# Patient Record
Sex: Male | Born: 1965 | Race: White | Hispanic: No
Health system: Southern US, Community
[De-identification: ages and names within clinical notes are randomized; demographics above are authoritative.]

---

## 2018-02-25 ENCOUNTER — Emergency Department (HOSPITAL_COMMUNITY): Payer: BLUE CROSS/BLUE SHIELD

## 2018-02-25 ENCOUNTER — Emergency Department (HOSPITAL_COMMUNITY)
Admission: EM | Admit: 2018-02-25 | Discharge: 2018-02-25 | Disposition: A | Discharge status: Home / Self Care | Payer: BLUE CROSS/BLUE SHIELD | Attending: Emergency Medicine | Admitting: Emergency Medicine

## 2018-02-25 ENCOUNTER — Encounter (HOSPITAL_COMMUNITY): Payer: Self-pay | Admitting: Radiology

## 2018-02-25 DIAGNOSIS — R1032 Left lower quadrant pain: Secondary | ICD-10-CM | POA: Insufficient documentation

## 2018-02-25 DIAGNOSIS — Y939 Activity, unspecified: Secondary | ICD-10-CM | POA: Diagnosis not present

## 2018-02-25 DIAGNOSIS — Y929 Unspecified place or not applicable: Secondary | ICD-10-CM | POA: Diagnosis not present

## 2018-02-25 DIAGNOSIS — R0789 Other chest pain: Secondary | ICD-10-CM | POA: Insufficient documentation

## 2018-02-25 DIAGNOSIS — Z041 Encounter for examination and observation following transport accident: Secondary | ICD-10-CM | POA: Diagnosis present

## 2018-02-25 DIAGNOSIS — Y999 Unspecified external cause status: Secondary | ICD-10-CM | POA: Diagnosis not present

## 2018-02-25 LAB — CBC WITH DIFFERENTIAL/PLATELET
BASOS ABS: 0.1 10*3/uL (ref 0.0–0.1)
Basophils Relative: 1 %
EOS ABS: 0.3 10*3/uL (ref 0.0–0.7)
EOS PCT: 3 %
HCT: 43.1 % (ref 39.0–52.0)
HEMOGLOBIN: 14.2 g/dL (ref 13.0–17.0)
LYMPHS ABS: 1.7 10*3/uL (ref 0.7–4.0)
LYMPHS PCT: 18 %
MCH: 30.6 pg (ref 26.0–34.0)
MCHC: 32.9 g/dL (ref 30.0–36.0)
MCV: 92.9 fL (ref 78.0–100.0)
Monocytes Absolute: 1.3 10*3/uL — ABNORMAL HIGH (ref 0.1–1.0)
Monocytes Relative: 14 %
NEUTROS PCT: 64 %
Neutro Abs: 6.4 10*3/uL (ref 1.7–7.7)
PLATELETS: 333 10*3/uL (ref 150–400)
RBC: 4.64 MIL/uL (ref 4.22–5.81)
RDW: 13 % (ref 11.5–15.5)
WBC: 9.9 10*3/uL (ref 4.0–10.5)

## 2018-02-25 LAB — I-STAT CHEM 8, ED
BUN: 15 mg/dL (ref 6–20)
CHLORIDE: 98 mmol/L — AB (ref 101–111)
Calcium, Ion: 1.05 mmol/L — ABNORMAL LOW (ref 1.15–1.40)
Creatinine, Ser: 0.9 mg/dL (ref 0.61–1.24)
Glucose, Bld: 106 mg/dL — ABNORMAL HIGH (ref 65–99)
HCT: 36 % — ABNORMAL LOW (ref 39.0–52.0)
Hemoglobin: 12.2 g/dL — ABNORMAL LOW (ref 13.0–17.0)
Potassium: 4.4 mmol/L (ref 3.5–5.1)
SODIUM: 132 mmol/L — AB (ref 135–145)
TCO2: 26 mmol/L (ref 22–32)

## 2018-02-25 LAB — HEPATIC FUNCTION PANEL
ALBUMIN: 4 g/dL (ref 3.5–5.0)
ALK PHOS: 81 U/L (ref 38–126)
ALT: 61 U/L (ref 17–63)
AST: 47 U/L — ABNORMAL HIGH (ref 15–41)
BILIRUBIN TOTAL: 0.7 mg/dL (ref 0.3–1.2)
Bilirubin, Direct: 0.1 mg/dL (ref 0.1–0.5)
Indirect Bilirubin: 0.6 mg/dL (ref 0.3–0.9)
TOTAL PROTEIN: 7.7 g/dL (ref 6.5–8.1)

## 2018-02-25 MED ORDER — TRAMADOL HCL 50 MG PO TABS: 50.0000 mg | ORAL_TABLET | Freq: Four times a day (QID) | ORAL | 0 refills | Status: AC | PRN

## 2018-02-25 MED ORDER — IOPAMIDOL (ISOVUE-300) INJECTION 61%
INTRAVENOUS | Status: AC
  Administered 2018-02-25: 100 mL via INTRAVENOUS
  Filled 2018-02-25: qty 100

## 2018-02-25 MED ORDER — IOPAMIDOL (ISOVUE-300) INJECTION 61%
100.0000 mL | Freq: Once | INTRAVENOUS | Status: AC | PRN
  Administered 2018-02-25: 100 mL via INTRAVENOUS
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
## 2018-02-25 NOTE — Discharge Instructions (Signed)
Follow up with your md if needed °
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
## 2020-09-28 ENCOUNTER — Other Ambulatory Visit (HOSPITAL_COMMUNITY): Payer: Self-pay | Admitting: Gastroenterology

## 2020-09-28 ENCOUNTER — Other Ambulatory Visit: Payer: Self-pay | Admitting: Gastroenterology

## 2020-09-28 DIAGNOSIS — R911 Solitary pulmonary nodule: Secondary | ICD-10-CM

## 2020-10-12 ENCOUNTER — Ambulatory Visit (HOSPITAL_COMMUNITY): Payer: No Typology Code available for payment source

## 2020-10-16 ENCOUNTER — Other Ambulatory Visit: Payer: Self-pay

## 2020-10-16 ENCOUNTER — Ambulatory Visit (HOSPITAL_COMMUNITY)
Admission: RE | Admit: 2020-10-16 | Discharge: 2020-10-16 | Disposition: A | Discharge status: Home / Self Care | Payer: BC Managed Care – PPO | Source: Ambulatory Visit | Attending: Gastroenterology | Admitting: Gastroenterology

## 2020-10-16 DIAGNOSIS — R911 Solitary pulmonary nodule: Secondary | ICD-10-CM | POA: Insufficient documentation

## 2021-01-17 IMAGING — CT CT CHEST W/O CM
2 of 3 series · 15 of 36 positions shown, 18 images · non-contrast
Comparison: February 25, 2018

CLINICAL DATA: Lung nodule follow-up, no prior surgeries

EXAM:
CT CHEST WITHOUT CONTRAST
TECHNIQUE: Multidetector CT imaging of the chest was performed following the
standard protocol without IV contrast.

[Series 2: thorax · axial · 0.88mm/px · z∈[-411,-127]mm · 12 of 168 slices shown, 15 images]
[im 13/168  mediastinal]
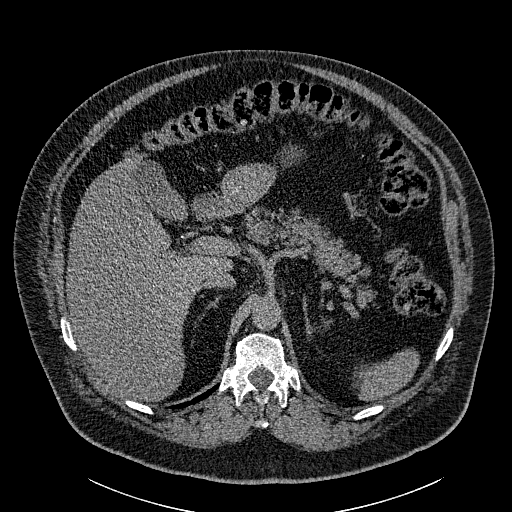
[im 13/168  lung]
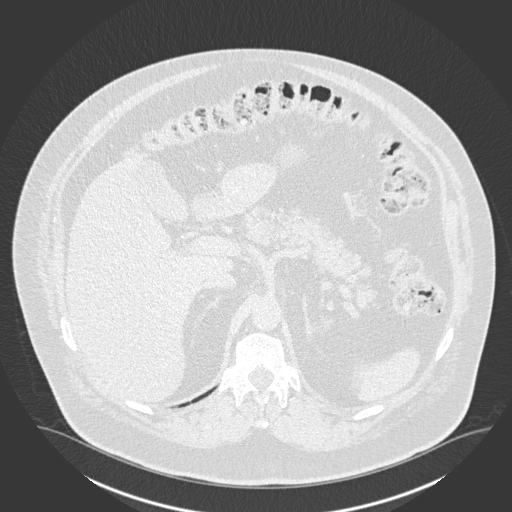
[im 25/168  lung]
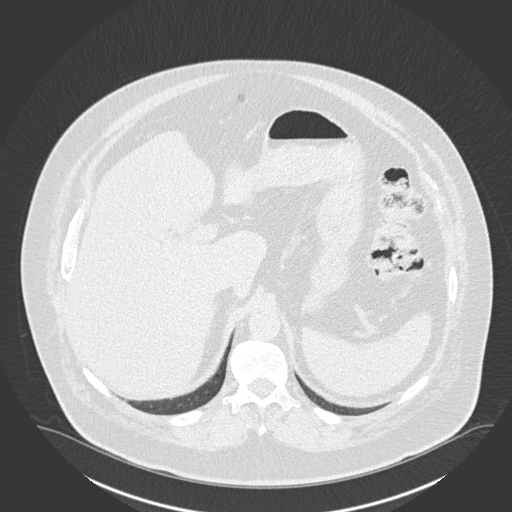
[im 38/168  lung]
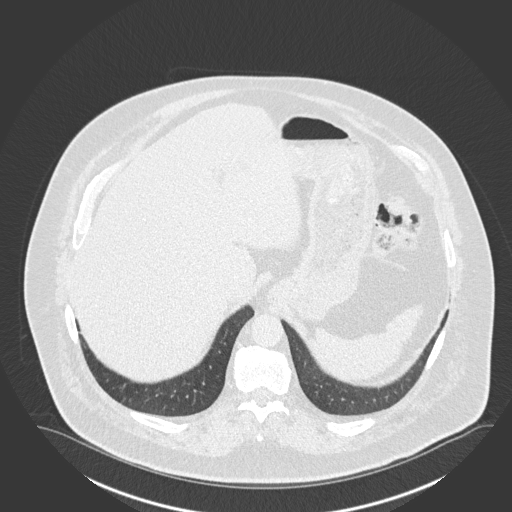
[im 50/168  lung]
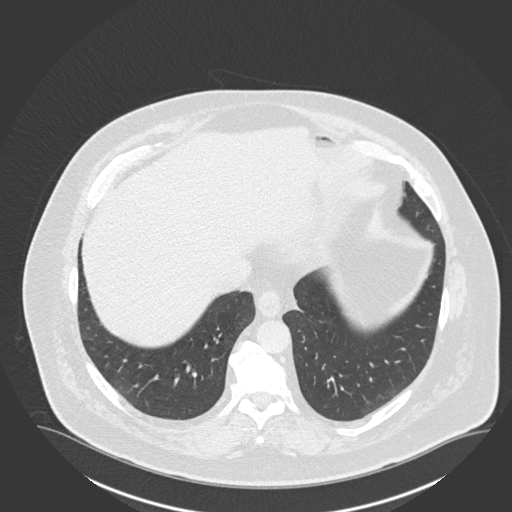
[im 62/168  mediastinal]
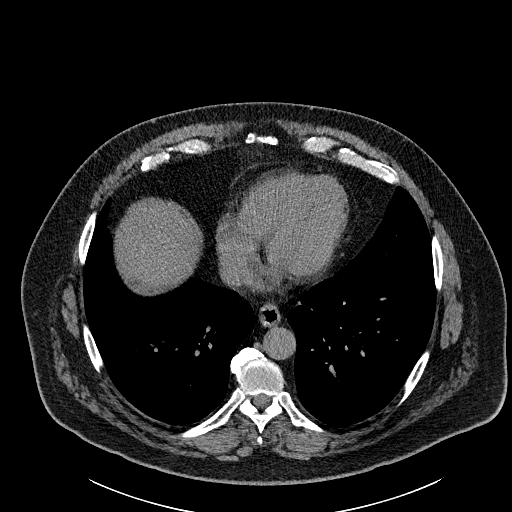
[im 62/168  lung]
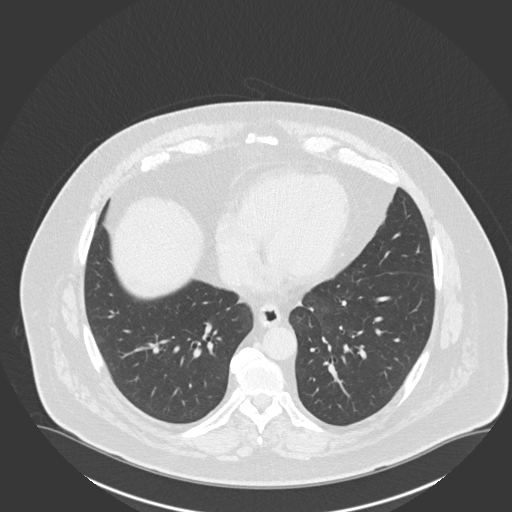
[im 75/168  lung]
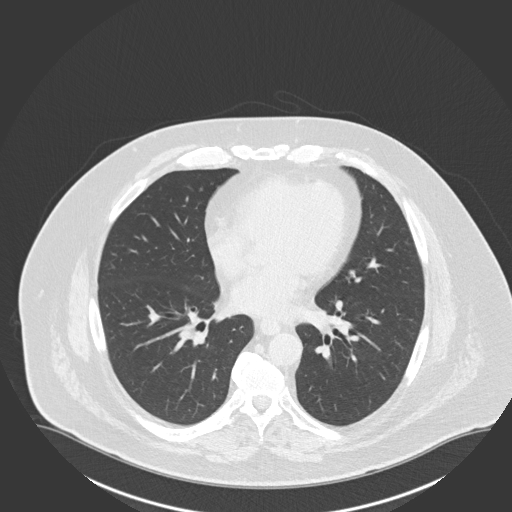
[im 93/168  lung]
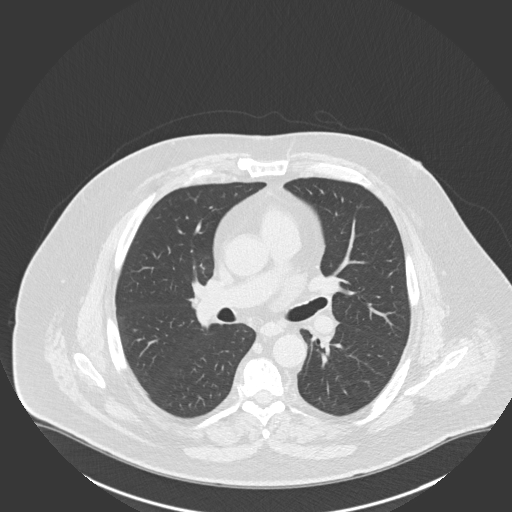
[im 106/168  lung]
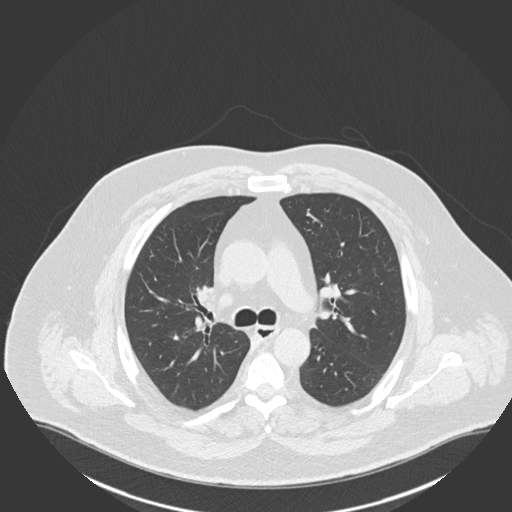
[im 118/168  mediastinal]
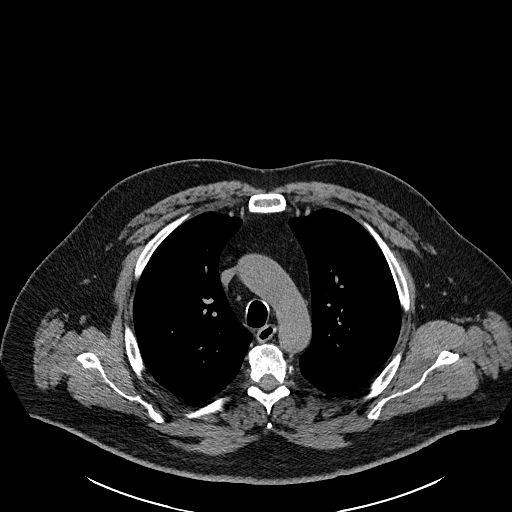
[im 118/168  lung]
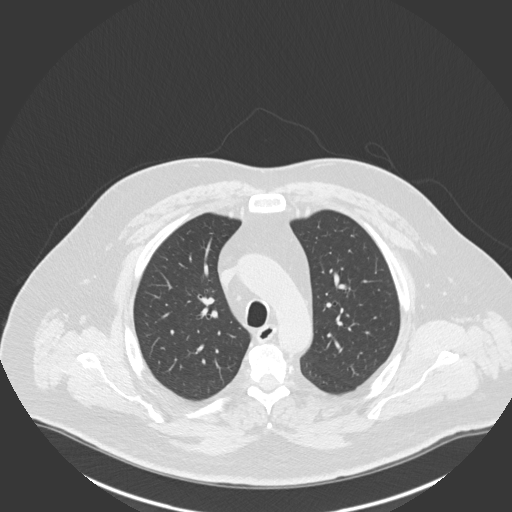
[im 130/168  lung]
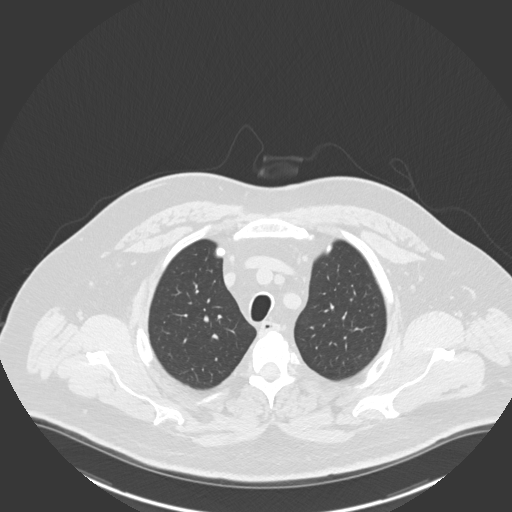
[im 143/168  lung]
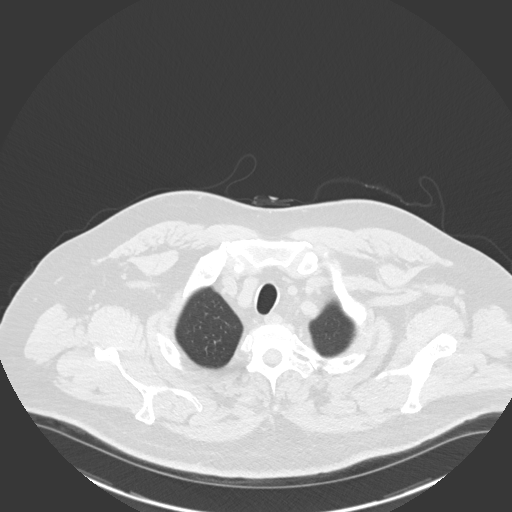
[im 155/168  lung]
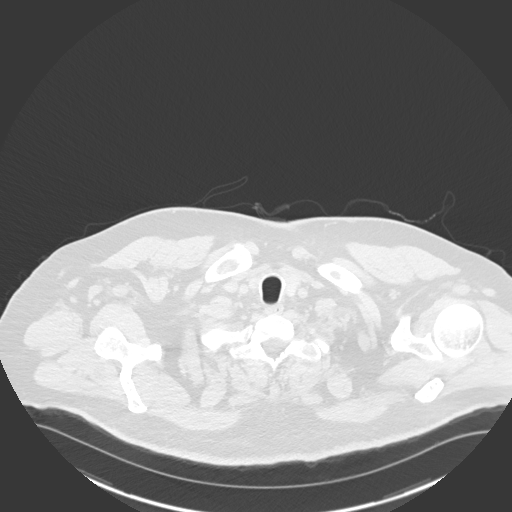

[Series 6: coronal · coronal · 0.65mm/px · 3 of 208 slices shown]
[im 42/208  lung]
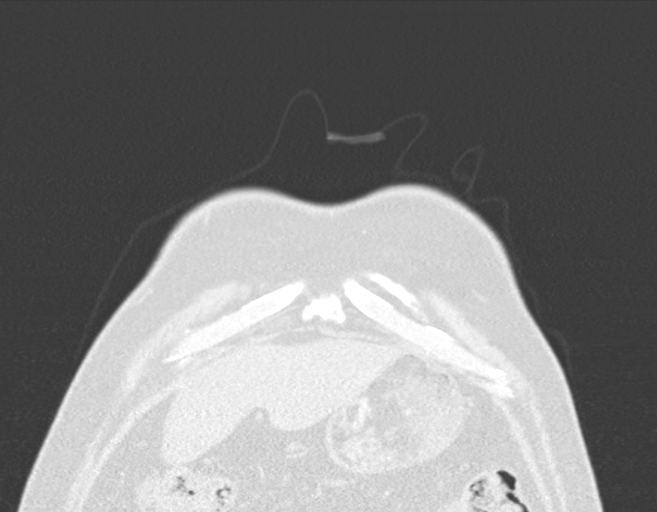
[im 83/208  lung]
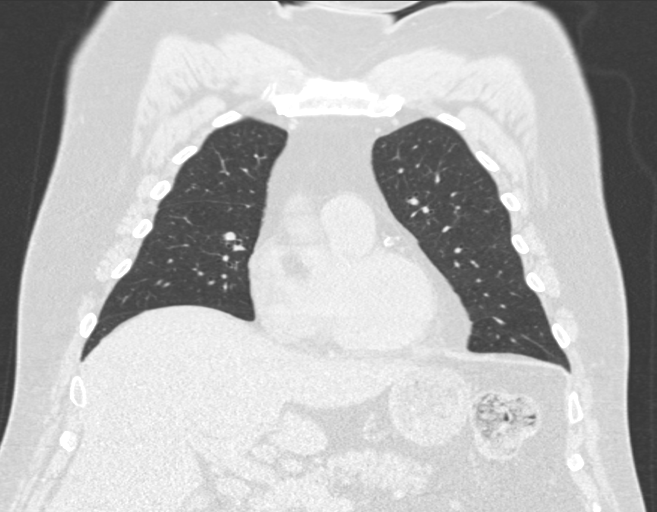
[im 125/208  lung]
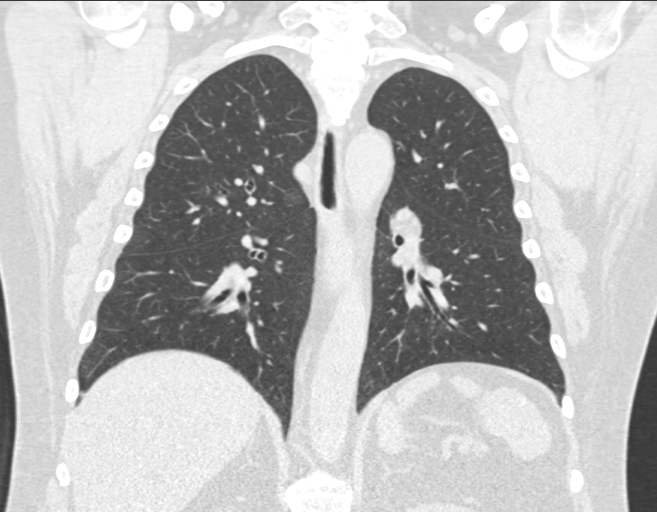

[15 of 36 positions shown; findings below may reference images not displayed]

FINDINGS: Cardiovascular: Normal caliber thoracic aorta. Calcified coronary
artery disease of LEFT coronary circulation with scattered
calcification in RIGHT coronary circulation. No pericardial
effusion. Normal caliber central pulmonary vasculature. Limited
assessment of cardiovascular structures given lack of intravenous
contrast.

Mediastinum/Nodes: Thoracic inlet structures are normal. Esophagus
grossly normal. No axillary lymphadenopathy. No mediastinal
adenopathy. No hilar adenopathy.

Lungs/Pleura: 3 mm pulmonary nodule in the lingula unchanged from
8433. No consolidation. No sign of pleural effusion.

7 x 4 mm pulmonary nodule in the LEFT lower lobe (image 123, series
5), previously 6 x 4 mm.

No consolidation or sign of pleural effusion.

Upper Abdomen: Signs of hepatic steatosis. Liver incompletely
imaged. No acute upper abdominal process.

Musculoskeletal: Spinal degenerative changes. No acute bone finding.
No destructive bone process.
IMPRESSION: 1. Scattered small pulmonary nodules within 1 mm of previous
measurement, 2 years prior. Largest approximately 6 mm mean diameter
as compared to 5 mm. Consider 12 month follow-up given minimal
change. This is more suggestive of benign process.
2. Signs of hepatic steatosis.
3. Coronary artery disease
